# Patient Record
Sex: Male | Born: 1960 | Hispanic: No | Marital: Single | Smoking: Never smoker
Health system: Southern US, Community
[De-identification: ages and names within clinical notes are randomized; demographics above are authoritative.]

## PROBLEM LIST (undated history)

## (undated) DIAGNOSIS — I10 Essential (primary) hypertension: Secondary | ICD-10-CM

## (undated) DIAGNOSIS — N289 Disorder of kidney and ureter, unspecified: Secondary | ICD-10-CM

## (undated) HISTORY — PX: LITHOTRIPSY: SUR834

---

## 1998-08-11 ENCOUNTER — Ambulatory Visit (HOSPITAL_COMMUNITY): Admission: RE | Admit: 1998-08-11 | Discharge: 1998-08-11 | Payer: Self-pay | Admitting: Urology

## 1998-08-11 ENCOUNTER — Encounter: Payer: Self-pay | Admitting: Urology

## 1998-08-29 ENCOUNTER — Ambulatory Visit (HOSPITAL_COMMUNITY): Admission: RE | Admit: 1998-08-29 | Discharge: 1998-08-29 | Payer: Self-pay | Admitting: Urology

## 1998-08-29 ENCOUNTER — Encounter: Payer: Self-pay | Admitting: Urology

## 2013-08-18 ENCOUNTER — Encounter (HOSPITAL_COMMUNITY): Payer: Self-pay | Admitting: Emergency Medicine

## 2013-08-18 ENCOUNTER — Emergency Department (HOSPITAL_COMMUNITY)
Admission: EM | Admit: 2013-08-18 | Discharge: 2013-08-18 | Disposition: A | Discharge status: Home / Self Care | Payer: Worker's Compensation | Attending: Emergency Medicine | Admitting: Emergency Medicine

## 2013-08-18 DIAGNOSIS — W268XXA Contact with other sharp object(s), not elsewhere classified, initial encounter: Secondary | ICD-10-CM | POA: Insufficient documentation

## 2013-08-18 DIAGNOSIS — Z23 Encounter for immunization: Secondary | ICD-10-CM | POA: Insufficient documentation

## 2013-08-18 DIAGNOSIS — Y9389 Activity, other specified: Secondary | ICD-10-CM | POA: Insufficient documentation

## 2013-08-18 DIAGNOSIS — Y990 Civilian activity done for income or pay: Secondary | ICD-10-CM | POA: Diagnosis not present

## 2013-08-18 DIAGNOSIS — Y9289 Other specified places as the place of occurrence of the external cause: Secondary | ICD-10-CM | POA: Diagnosis not present

## 2013-08-18 DIAGNOSIS — S61209A Unspecified open wound of unspecified finger without damage to nail, initial encounter: Secondary | ICD-10-CM | POA: Diagnosis present

## 2013-08-18 DIAGNOSIS — S61011A Laceration without foreign body of right thumb without damage to nail, initial encounter: Secondary | ICD-10-CM

## 2013-08-18 MED ORDER — TETANUS-DIPHTH-ACELL PERTUSSIS 5-2.5-18.5 LF-MCG/0.5 IM SUSP
0.5000 mL | Freq: Once | INTRAMUSCULAR | Status: AC
  Administered 2013-08-18: 0.5 mL via INTRAMUSCULAR
  Filled 2013-08-18: qty 0.5

## 2013-08-18 NOTE — Discharge Instructions (Signed)
Take ibuprofen or tylenol as needed for pain.  Keep wound clean and dry.  Follow up with your primary care doctor or [facility] [phone]; [address]) in 7-10 days for wound recheck and suture removal.  You should be seen sooner if you develop fever, worsening pain or redness/drainage of pus at site of wound.    Laceration Care, Adult A laceration is a cut or lesion that goes through all layers of the skin and into the tissue just beneath the skin. TREATMENT  Some lacerations may not require closure. Some lacerations may not be able to be closed due to an increased risk of infection. It is important to see your caregiver as soon as possible after an injury to minimize the risk of infection and maximize the opportunity for successful closure. If closure is appropriate, pain medicines may be given, if needed. The wound will be cleaned to help prevent infection. Your caregiver will use stitches (sutures), staples, wound glue (adhesive), or skin adhesive strips to repair the laceration. These tools bring the skin edges together to allow for faster healing and a better cosmetic outcome. However, all wounds will heal with a scar. Once the wound has healed, scarring can be minimized by covering the wound with sunscreen during the day for 1 full year. HOME CARE INSTRUCTIONS  For sutures or staples:  Keep the wound clean and dry.  If you were given a bandage (dressing), you should change it at least once a day. Also, change the dressing if it becomes wet or dirty, or as directed by your caregiver.  Wash the wound with soap and water 2 times a day. Rinse the wound off with water to remove all soap. Pat the wound dry with a clean towel.  After cleaning, apply a thin layer of the antibiotic ointment as recommended by your caregiver. This will help prevent infection and keep the dressing from sticking.  You may shower as usual after the first 24 hours. Do not soak the wound in water until the  sutures are removed.  Only take over-the-counter or prescription medicines for pain, discomfort, or fever as directed by your caregiver.  Get your sutures or staples removed as directed by your caregiver. For skin adhesive strips:  Keep the wound clean and dry.  Do not get the skin adhesive strips wet. You may bathe carefully, using caution to keep the wound dry.  If the wound gets wet, pat it dry with a clean towel.  Skin adhesive strips will fall off on their own. You may trim the strips as the wound heals. Do not remove skin adhesive strips that are still stuck to the wound. They will fall off in time. For wound adhesive:  You may briefly wet your wound in the shower or bath. Do not soak or scrub the wound. Do not swim. Avoid periods of heavy perspiration until the skin adhesive has fallen off on its own. After showering or bathing, gently pat the wound dry with a clean towel.  Do not apply liquid medicine, cream medicine, or ointment medicine to your wound while the skin adhesive is in place. This may loosen the film before your wound is healed.  If a dressing is placed over the wound, be careful not to apply tape directly over the skin adhesive. This may cause the adhesive to be pulled off before the wound is healed.  Avoid prolonged exposure to sunlight or tanning lamps while the skin adhesive is in place.  Exposure to ultraviolet light in the first year will darken the scar.  The skin adhesive will usually remain in place for 5 to 10 days, then naturally fall off the skin. Do not pick at the adhesive film. You may need a tetanus shot if:  You cannot remember when you had your last tetanus shot.  You have never had a tetanus shot. If you get a tetanus shot, your arm may swell, get red, and feel warm to the touch. This is common and not a problem. If you need a tetanus shot and you choose not to have one, there is a rare chance of getting tetanus. Sickness from tetanus can be  serious. SEEK MEDICAL CARE IF:   You have redness, swelling, or increasing pain in the wound.  You see a red line that goes away from the wound.  You have yellowish-white fluid (pus) coming from the wound.  You have a fever.  You notice a bad smell coming from the wound or dressing.  Your wound breaks open before or after sutures have been removed.  You notice something coming out of the wound such as wood or glass.  Your wound is on your hand or foot and you cannot move a finger or toe. SEEK IMMEDIATE MEDICAL CARE IF:   Your pain is not controlled with prescribed medicine.  You have severe swelling around the wound causing pain and numbness or a change in color in your arm, hand, leg, or foot.  Your wound splits open and starts bleeding.  You have worsening numbness, weakness, or loss of function of any joint around or beyond the wound.  You develop painful lumps near the wound or on the skin anywhere on your body. MAKE SURE YOU:   Understand these instructions.  Will watch your condition.  Will get help right away if you are not doing well or get worse. Document Released: 05/21/2005 Document Revised: 08/13/2011 Document Reviewed: 11/14/2010 [facility] Patient Information 2014 [employer] [place].

## 2013-08-18 NOTE — ED Provider Notes (Signed)
Medical screening examination/treatment/procedure(s) were performed by non-physician practitioner and as supervising physician I was immediately available for consultation/collaboration.     Geoffery Lyonsouglas Donnisha Besecker, MD 08/18/13 [phone]

## 2013-08-18 NOTE — ED Notes (Signed)
Patent here with laceration to right thumb from circular blade.  Patient works at [employer].  Bleeding controlled at this time.

## 2013-08-18 NOTE — ED Notes (Signed)
DSD applied to right thumb.

## 2013-08-18 NOTE — ED Provider Notes (Signed)
CSN: [id number]     Arrival date & time 08/18/13  0053 History   First MD Initiated Contact with Patient 08/18/13 [phone]     Chief Complaint  Patient presents with  . Extremity Laceration     (Consider location/radiation/quality/duration/timing/severity/associated sxs/prior Treatment) HPI History provided by pt.   Pt cut his right thumb with razor blade at work this evening.  Pain minimal and bleeding controlled.  No associated paresthesias.  Most recent tetanus unknown.    History reviewed. No pertinent past medical history. History reviewed. No pertinent past surgical history. No family history on file. History  Substance Use Topics  . Smoking status: Never Smoker   . Smokeless tobacco: Not on file  . Alcohol Use: No    Review of Systems  All other systems reviewed and are negative.      Allergies  Review of patient's allergies indicates no known allergies.  Home Medications  No current outpatient prescriptions on file. BP 164/97  Pulse 90  Temp(Src) 98.6 F (37 C) (Oral)  Resp 17  SpO2 97% Physical Exam  Nursing note and vitals reviewed. Constitutional: He is oriented to person, place, and time. He appears well-developed and well-nourished. No distress.  HENT:  Head: Normocephalic and atraumatic.  Eyes:  Normal appearance  Neck: Normal range of motion.  Pulmonary/Chest: Effort normal.  Musculoskeletal: Normal range of motion.  2.5cm superficial, horizontal lac on lateral surface of distal phalanx of right thumb.  Oozing.  Clean.  Distal sensation intact.   Neurological: He is alert and oriented to person, place, and time.  Psychiatric: He has a normal mood and affect. His behavior is normal.    ED Course  Procedures (including critical care time) LACERATION REPAIR Performed by: Otilio MiuSCHINLEVER, Cecylia Brazill E Authorized by: Ruby ColaSCHINLEVER, Joshuwa Vecchio E Consent: Verbal consent obtained. Risks and benefits: risks, benefits and alternatives were discussed Consent given  by: patient Patient identity confirmed: provided demographic data Prepped and Draped in normal sterile fashion Wound explored  Laceration Location: right thumb  Laceration Length: 2.5cm  No Foreign Bodies seen or palpated  Anesthesia: local infiltration  Digital block: lidocaine 2% w/out epinephrine  Anesthetic total: 4 ml  Irrigation method: syringe Amount of cleaning: standard  Skin closure: prolene 4.0  Number of sutures: 6  Technique: simple interrupted  Patient tolerance: Patient tolerated the procedure well with no immediate complications.  Labs Review Labs Reviewed - No data to display Imaging Review No results found.   EKG Interpretation None      MDM   Final diagnoses:  Laceration of right thumb    52yo healthy M presents w/ lac to R thumb.  Wound cleaned and sutured and tetanus updated.  Return precautions discussed.     Otilio Miuatherine E Jujuan Dugo, PA-C 08/18/13 [phone]

## 2013-09-13 ENCOUNTER — Emergency Department (HOSPITAL_COMMUNITY)
Admission: EM | Admit: 2013-09-13 | Discharge: 2013-09-13 | Disposition: A | Discharge status: Home / Self Care | Payer: BC Managed Care – PPO | Attending: Emergency Medicine | Admitting: Emergency Medicine

## 2013-09-13 ENCOUNTER — Encounter (HOSPITAL_COMMUNITY): Payer: Self-pay | Admitting: Emergency Medicine

## 2013-09-13 DIAGNOSIS — I10 Essential (primary) hypertension: Secondary | ICD-10-CM | POA: Insufficient documentation

## 2013-09-13 DIAGNOSIS — Z87448 Personal history of other diseases of urinary system: Secondary | ICD-10-CM | POA: Insufficient documentation

## 2013-09-13 DIAGNOSIS — M436 Torticollis: Secondary | ICD-10-CM | POA: Insufficient documentation

## 2013-09-13 HISTORY — DX: Essential (primary) hypertension: I10

## 2013-09-13 HISTORY — DX: Disorder of kidney and ureter, unspecified: N28.9

## 2013-09-13 MED ORDER — NAPROXEN 500 MG PO TABS: 500.0000 mg | ORAL_TABLET | Freq: Two times a day (BID) | ORAL | Status: DC

## 2013-09-13 MED ORDER — OXYCODONE-ACETAMINOPHEN 5-325 MG PO TABS
1.0000 | ORAL_TABLET | Freq: Once | ORAL | Status: AC
Start: 2013-09-13 — End: 2013-09-13
  Administered 2013-09-13: 1 via ORAL
  Filled 2013-09-13: qty 1

## 2013-09-13 MED ORDER — KETOROLAC TROMETHAMINE 60 MG/2ML IM SOLN
60.0000 mg | Freq: Once | INTRAMUSCULAR | Status: AC
  Administered 2013-09-13: 60 mg via INTRAMUSCULAR
  Filled 2013-09-13: qty 2

## 2013-09-13 MED ORDER — CYCLOBENZAPRINE HCL 10 MG PO TABS
10.0000 mg | ORAL_TABLET | Freq: Once | ORAL | Status: AC
  Administered 2013-09-13: 10 mg via ORAL
  Filled 2013-09-13: qty 1

## 2013-09-13 MED ORDER — HYDROCODONE-ACETAMINOPHEN 5-325 MG PO TABS: 1.0000 | ORAL_TABLET | ORAL | Status: DC | PRN

## 2013-09-13 MED ORDER — CYCLOBENZAPRINE HCL 10 MG PO TABS: 10.0000 mg | ORAL_TABLET | Freq: Two times a day (BID) | ORAL | Status: DC | PRN

## 2013-09-13 NOTE — Discharge Instructions (Signed)
Apply warm wet compresses to the area. Take the medication as directed. Return as needed. °

## 2013-09-13 NOTE — ED Provider Notes (Signed)
CSN: [id number]     Arrival date & time 09/13/13  1335 History  This chart was scribed for non-physician practitioner, Kerrie BuffaloHope Jolan Upchurch, FNP,working with Benny LennertJoseph L Zammit, MD, by Karle PlumberJennifer Tensley, ED Scribe.  This patient was seen in room APFT24/APFT24 and the patient's care was started at 2:14 PM.  Chief Complaint  Patient presents with  . Torticollis   The history is provided by the patient. No language interpreter was used.   HPI Comments:  Benjamin Livingston is a 53 y.o. male with h/o HTN who presents to the Emergency Department complaining of severe right-sided neck tightness that started approximately six hours ago. Pt states that upon waking this morning he had severe pain with limited ROM secondary to the pain of his neck. Pt states he has not taken anything for pain. He states movement of his head makes the pain increase. He denies numbness or tingling in bilateral hands and fingers, cough, cold, congestion, fever, chills, or cervical spine tenderness.   Past Medical History  Diagnosis Date  . Hypertension   . Renal disorder    No past surgical history on file. No family history on file. History  Substance Use Topics  . Smoking status: Never Smoker   . Smokeless tobacco: Not on file  . Alcohol Use: No    Review of Systems  Constitutional: Negative for fever and chills.  HENT: Negative for congestion.   Respiratory: Negative for cough.   Musculoskeletal: Positive for neck pain. Negative for back pain.  Neurological: Negative for numbness.    Allergies  Review of patient's allergies indicates no known allergies.  Home Medications  No current outpatient prescriptions on file. Triage Vitals: BP 148/100  Pulse 85  Temp(Src) 98.4 F (36.9 C) (Oral)  Resp 20  Ht 6\' 1"  (1.854 m)  Wt 220 lb (99.791 kg)  BMI 29.03 kg/m2  SpO2 98% Physical Exam  Nursing note and vitals reviewed. Constitutional: He is oriented to person, place, and time. He appears well-developed and  well-nourished.  HENT:  Right Ear: Tympanic membrane, external ear and ear canal normal.  Left Ear: Tympanic membrane, external ear and ear canal normal.  Mouth/Throat: Uvula is midline, oropharynx is clear and moist and mucous membranes are normal.  No TMJ tenderness.  Eyes: Conjunctivae and EOM are normal. Pupils are equal, round, and reactive to light.  Neck: Neck supple.  Spasm noted to right trapezius. No cervical spine tenderness.  Cardiovascular: Normal rate, regular rhythm and normal heart sounds.  Exam reveals no gallop and no friction rub.   No murmur heard. Pulmonary/Chest: Effort normal and breath sounds normal. No respiratory distress. He has no wheezes. He has no rales.  Abdominal: Soft. There is no tenderness.  Musculoskeletal: Normal range of motion. He exhibits no edema and no tenderness.  Lymphadenopathy:    He has no cervical adenopathy.  Neurological: He is alert and oriented to person, place, and time. No cranial nerve deficit.  No neurological deficits. No evidence of nerve damage.  Skin: Skin is warm and dry.    ED Course  Procedures (including critical care time) DIAGNOSTIC STUDIES: Oxygen Saturation is 98% on RA, normal by my interpretation.   COORDINATION OF CARE: 2:18 PM- Will prescribe muscle relaxer, NSAID, and pain medication. Advised pt to apply warm, moist compresses to area three times daily. Pt verbalizes understanding and agrees to plan.  Medications  cyclobenzaprine (FLEXERIL) tablet 10 mg (10 mg Oral Given 09/13/13 1426)  oxyCODONE-acetaminophen (PERCOCET/ROXICET) 5-325 MG per tablet 1  tablet (1 tablet Oral Given 09/13/13 1426)  ketorolac (TORADOL) injection 60 mg (60 mg Intramuscular Given 09/13/13 1504)     MDM  53 y.o. male with sudden onset of right side neck pain upon awaking this am. Improved some with Toradol, Percocet and Flexeril here in the ED.  I have reviewed this patient's vital signs, nurses notes and discussed clinical findings  and plan of care. Patient voices understanding. Stable for discharge without neurological deficits and no cervical spin tenderness. Patient to apply warm wet compresses to the area and take the medications as directed. He will return for worsening symptoms, fever or other problems.    Medication List         cyclobenzaprine 10 MG tablet  Commonly known as:  FLEXERIL  Take 1 tablet (10 mg total) by mouth 2 (two) times daily as needed for muscle spasms.     HYDROcodone-acetaminophen 5-325 MG per tablet  Commonly known as:  NORCO/VICODIN  Take 1 tablet by mouth every 4 (four) hours as needed.     naproxen 500 MG tablet  Commonly known as:  NAPROSYN  Take 1 tablet (500 mg total) by mouth 2 (two) times daily.         I personally performed the services described in this documentation, which was scribed in my presence. The recorded information has been reviewed and is accurate.    [facility] Orlene Och, [place] 09/13/13 1622

## 2013-09-13 NOTE — ED Notes (Signed)
Pt states he woke up with stiffness in his neck

## 2013-09-16 NOTE — ED Provider Notes (Signed)
Medical screening examination/treatment/procedure(s) were performed by non-physician practitioner and as supervising physician I was immediately available for consultation/collaboration.   EKG Interpretation None        Emilia Kayes L Jenia Klepper, MD 09/16/13 0713 

## 2015-07-17 ENCOUNTER — Emergency Department (HOSPITAL_COMMUNITY)
Admission: EM | Admit: 2015-07-17 | Discharge: 2015-07-17 | Disposition: A | Discharge status: Home / Self Care | Payer: BLUE CROSS/BLUE SHIELD | Source: Home / Self Care | Attending: Family Medicine | Admitting: Family Medicine

## 2015-07-17 ENCOUNTER — Encounter (HOSPITAL_COMMUNITY): Payer: Self-pay | Admitting: Emergency Medicine

## 2015-07-17 DIAGNOSIS — H0489 Other disorders of lacrimal system: Secondary | ICD-10-CM | POA: Diagnosis not present

## 2015-07-17 DIAGNOSIS — Z91048 Other nonmedicinal substance allergy status: Secondary | ICD-10-CM | POA: Diagnosis not present

## 2015-07-17 DIAGNOSIS — Z9109 Other allergy status, other than to drugs and biological substances: Secondary | ICD-10-CM

## 2015-07-17 MED ORDER — AMOXICILLIN 500 MG PO CAPS: 500.0000 mg | ORAL_CAPSULE | Freq: Three times a day (TID) | ORAL | Status: DC

## 2015-07-17 MED ORDER — OLOPATADINE HCL 0.2 % OP SOLN: 1.0000 [drp] | OPHTHALMIC | Status: DC

## 2015-07-17 NOTE — ED Provider Notes (Signed)
CSN: [id number]     Arrival date & time 07/17/15  1301 History   First MD Initiated Contact with Patient 07/17/15 1407     Chief Complaint  Patient presents with  . Eye Drainage  . Stye   (Consider location/radiation/quality/duration/timing/severity/associated sxs/prior Treatment) HPI Comments: Patient presents with itching to the left eye x 4 days. He also reports intermittent swelling of the left inner eye and below the eye. No sinus congestion as he is aware and no fevers. Some left ear pain is noted. No history of allergies.  The history is provided by the patient.    Past Medical History  Diagnosis Date  . Hypertension   . Renal disorder    History reviewed. No pertinent past surgical history. No family history on file. Social History  Substance Use Topics  . Smoking status: Never Smoker   . Smokeless tobacco: None  . Alcohol Use: No    Review of Systems  Constitutional: Negative for fever, chills and fatigue.  HENT: Positive for ear pain. Negative for postnasal drip, rhinorrhea and sinus pressure.   Eyes: Positive for discharge, redness and itching. Negative for pain.  Respiratory: Negative.   Cardiovascular: Negative.   Musculoskeletal: Negative.   Skin: Negative.   Allergic/Immunologic: Negative.   Neurological: Negative.   Psychiatric/Behavioral: Negative.     Allergies  Review of patient's allergies indicates no known allergies.  Home Medications   Prior to Admission medications   Medication Sig Start Date End Date Taking? Authorizing Provider  amoxicillin (AMOXIL) 500 MG capsule Take 1 capsule (500 mg total) by mouth 3 (three) times daily. 07/17/15   Riki Sheer, PA-C  cyclobenzaprine (FLEXERIL) 10 MG tablet Take 1 tablet (10 mg total) by mouth 2 (two) times daily as needed for muscle spasms. 09/13/13   Hope Orlene Och, NP  HYDROcodone-acetaminophen (NORCO/VICODIN) 5-325 MG per tablet Take 1 tablet by mouth every 4 (four) hours as needed. 09/13/13   Hope Orlene Och, NP  naproxen (NAPROSYN) 500 MG tablet Take 1 tablet (500 mg total) by mouth 2 (two) times daily. 09/13/13   Hope Orlene Och, NP  Olopatadine HCl 0.2 % SOLN Apply 1 drop to eye 1 day or 1 dose. 07/17/15   Riki Sheer, PA-C   Meds Ordered and Administered this Visit  Medications - No data to display  BP 165/104 mmHg  Pulse 105  Temp(Src) 98.9 F (37.2 C) (Oral)  Resp 18  SpO2 99% No data found.   Physical Exam  Constitutional: He is oriented to person, place, and time. He appears well-developed and well-nourished. No distress.  HENT:  Head: Normocephalic and atraumatic.  Right Ear: External ear normal.  Left Ear: External ear normal.  Eyes: Pupils are equal, round, and reactive to light. Right eye exhibits no discharge. Left eye exhibits no discharge. No scleral icterus.  Mild swelling along left lacrimal gland, mild  Peri-orbital swelling. No sclera involvement and no drainage  Neurological: He is alert and oriented to person, place, and time.  Skin: Skin is warm and dry. He is not diaphoretic.  Psychiatric: His behavior is normal.  Nursing note and vitals reviewed.   ED Course  Procedures (including critical care time)  Labs Review Labs Reviewed - No data to display  Imaging Review No results found.   Visual Acuity Review  Right Eye Distance: 20/50 Left Eye Distance: 20/50 Bilateral Distance: 20/40  Right Eye Near:   Left Eye Near:    Bilateral Near:  MDM   1. Swelling of lacrimal duct with fluid   2. Environmental allergies    Probable viral and/or allergies. Given swelling cover empirically with antibiotics and allergen eye drops. F/U if worsens. Hot compresses.    Riki Sheer, PA-C 07/17/15 1430

## 2015-07-17 NOTE — Discharge Instructions (Signed)
Allergies An allergy is an abnormal reaction to a substance by the body's defense system (immune system). Allergies can develop at any age. WHAT CAUSES ALLERGIES? An allergic reaction happens when the immune system mistakenly reacts to a normally harmless substance, called an allergen, as if it were harmful. The immune system releases antibodies to fight the substance. Antibodies eventually release a chemical called histamine into the bloodstream. The release of histamine is meant to protect the body from infection, but it also causes discomfort. An allergic reaction can be triggered by:  Eating an allergen.  Inhaling an allergen.  Touching an allergen. WHAT TYPES OF ALLERGIES ARE THERE? There are many types of allergies. Common types include:  Seasonal allergies. People with this type of allergy are usually allergic to substances that are only present during certain seasons, such as molds and pollens.  Food allergies.  Drug allergies.  Insect allergies.  Animal dander allergies. WHAT ARE SYMPTOMS OF ALLERGIES? Possible allergy symptoms include:  Swelling of the lips, face, tongue, mouth, or throat.  Sneezing, coughing, or wheezing.  Nasal congestion.  Tingling in the mouth.  Rash.  Itching.  Itchy, red, swollen areas of skin (hives).  Watery eyes.  Vomiting.  Diarrhea.  Dizziness.  Lightheadedness.  Fainting.  Trouble breathing or swallowing.  Chest tightness.  Rapid heartbeat. HOW ARE ALLERGIES DIAGNOSED? Allergies are diagnosed with a medical and family history and one or more of the following:  Skin tests.  Blood tests.  A food diary. A food diary is a record of all the foods and drinks you have in a day and of all the symptoms you experience.  The results of an elimination diet. An elimination diet involves eliminating foods from your diet and then adding them back in one by one to find out if a certain food causes an allergic reaction. HOW ARE  ALLERGIES TREATED? There is no cure for allergies, but allergic reactions can be treated with medicine. Severe reactions usually need to be treated at a hospital. HOW CAN REACTIONS BE PREVENTED? The best way to prevent an allergic reaction is by avoiding the substance you are allergic to. Allergy shots and medicines can also help prevent reactions in some cases. People with severe allergic reactions may be able to prevent a life-threatening reaction called anaphylaxis with a medicine given right after exposure to the allergen.   This information is not intended to replace advice given to you by your health care provider. Make sure you discuss any questions you have with your health care provider.   Hopefully all of this is allergies and or viral. Covering you with an antibiotic for completeness. The drops are daily for itching/allergies. Warm compress to eye every few hours.  FU if worsens.   Document Released: 08/14/2002 Document Revised: 06/11/2014 Document Reviewed: 03/02/2014 Elsevier Interactive Patient Education [employer]

## 2015-07-17 NOTE — ED Notes (Signed)
Pt here with left itching, slight drainage and intermit swelling x 4 days Slight blurred vision without dizziness Tried eye drops, Benadryl with some relief

## 2018-12-12 ENCOUNTER — Encounter (HOSPITAL_COMMUNITY): Payer: Self-pay | Admitting: Emergency Medicine

## 2018-12-12 ENCOUNTER — Other Ambulatory Visit: Payer: Self-pay

## 2018-12-12 ENCOUNTER — Emergency Department (HOSPITAL_COMMUNITY)
Admission: EM | Admit: 2018-12-12 | Discharge: 2018-12-12 | Disposition: A | Discharge status: Home / Self Care | Payer: BC Managed Care – PPO | Attending: Emergency Medicine | Admitting: Emergency Medicine

## 2018-12-12 ENCOUNTER — Emergency Department (HOSPITAL_COMMUNITY): Payer: BC Managed Care – PPO

## 2018-12-12 DIAGNOSIS — S199XXA Unspecified injury of neck, initial encounter: Secondary | ICD-10-CM | POA: Diagnosis present

## 2018-12-12 DIAGNOSIS — S161XXA Strain of muscle, fascia and tendon at neck level, initial encounter: Secondary | ICD-10-CM | POA: Diagnosis not present

## 2018-12-12 DIAGNOSIS — Y999 Unspecified external cause status: Secondary | ICD-10-CM | POA: Diagnosis not present

## 2018-12-12 DIAGNOSIS — S46811A Strain of other muscles, fascia and tendons at shoulder and upper arm level, right arm, initial encounter: Secondary | ICD-10-CM | POA: Insufficient documentation

## 2018-12-12 DIAGNOSIS — I10 Essential (primary) hypertension: Secondary | ICD-10-CM | POA: Diagnosis not present

## 2018-12-12 DIAGNOSIS — S46812A Strain of other muscles, fascia and tendons at shoulder and upper arm level, left arm, initial encounter: Secondary | ICD-10-CM | POA: Diagnosis not present

## 2018-12-12 DIAGNOSIS — Y939 Activity, unspecified: Secondary | ICD-10-CM | POA: Insufficient documentation

## 2018-12-12 DIAGNOSIS — Y92410 Unspecified street and highway as the place of occurrence of the external cause: Secondary | ICD-10-CM | POA: Insufficient documentation

## 2018-12-12 DIAGNOSIS — T07XXXA Unspecified multiple injuries, initial encounter: Secondary | ICD-10-CM

## 2018-12-12 MED ORDER — TRAMADOL HCL 50 MG PO TABS: 50.0000 mg | ORAL_TABLET | Freq: Four times a day (QID) | ORAL | 0 refills | Status: AC | PRN

## 2018-12-12 MED ORDER — CYCLOBENZAPRINE HCL 10 MG PO TABS: 10.0000 mg | ORAL_TABLET | Freq: Three times a day (TID) | ORAL | 0 refills | Status: AC

## 2018-12-12 MED ORDER — ACETAMINOPHEN 500 MG PO TABS
1000.0000 mg | ORAL_TABLET | Freq: Once | ORAL | Status: AC
  Administered 2018-12-12: 1000 mg via ORAL
  Filled 2018-12-12: qty 2
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
## 2018-12-12 NOTE — Discharge Instructions (Addendum)
Your blood pressure is elevated at 153/111.  Please have this rechecked soon.  The CT scan of your cervical spine is negative for fracture or dislocation.  The examination shows some muscle strain and muscle spasm involving your upper trapezius muscles in the neck and shoulder area.  Heating pad to this area may be helpful.  You can expect to be sore over the next few days.  Please use Tylenol extra strength with breakfast, lunch, dinner, and at bedtime.  Use Flexeril 3 times daily for spasm area pain. Use Ultram for more severe pain. Ultram and Flexeril may cause drowsiness, and/or lightheadedness.  Please do not drive a vehicle, operate machinery, drink alcohol, or participate in activities requiring concentration when taking either these medications.  Please see your primary physician or return to the emergency department if not improving.
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
## 2020-11-06 ENCOUNTER — Encounter (HOSPITAL_COMMUNITY): Payer: Self-pay | Admitting: Emergency Medicine

## 2020-11-06 ENCOUNTER — Other Ambulatory Visit: Payer: Self-pay

## 2020-11-06 ENCOUNTER — Ambulatory Visit (HOSPITAL_COMMUNITY)
Admission: EM | Admit: 2020-11-06 | Discharge: 2020-11-06 | Disposition: A | Discharge status: Home / Self Care | Payer: BC Managed Care – PPO | Attending: Student | Admitting: Student

## 2020-11-06 DIAGNOSIS — W57XXXA Bitten or stung by nonvenomous insect and other nonvenomous arthropods, initial encounter: Secondary | ICD-10-CM | POA: Diagnosis not present

## 2020-11-06 DIAGNOSIS — Z0289 Encounter for other administrative examinations: Secondary | ICD-10-CM | POA: Diagnosis not present

## 2020-11-06 DIAGNOSIS — S30860A Insect bite (nonvenomous) of lower back and pelvis, initial encounter: Secondary | ICD-10-CM

## 2020-11-06 MED ORDER — DOXYCYCLINE HYCLATE 100 MG PO CAPS
100.0000 mg | ORAL_CAPSULE | Freq: Two times a day (BID) | ORAL | 0 refills | Status: AC
Start: 2020-11-06 — End: 2020-11-13

## 2020-11-06 NOTE — Discharge Instructions (Addendum)
-  Doxycycline twice daily for 7 days.  Make sure to wear sunscreen while spending time outside on this medication as it can increase your chance of sunburn. -You can try warm compresses to help loosen up the remaining part of the tick. -Symptoms of Lyme disease are a bull's-eye rash, fever/chills, body aches.  If you develop the symptoms despite antibiotic treatment, seek additional medical attention.

## 2020-11-06 NOTE — ED Provider Notes (Signed)
[facility]    CSN: [id number] Arrival date & time: 11/06/20  1123      History   Chief Complaint Chief Complaint  Patient presents with  . Insect Bite    HPI Benjamin Livingston is a 60 y.o. male presenting with a tick bite on his testicles.  Medical history hypertension and kidney stones.  States he spent time outside wearing shorts.  This morning he noticed a tick on his right testicle.  States he removed this with tweezers, but he is concerned that some of the head is left.  States he sees a black dot on the right testicle.  He is feeling well otherwise, denies rashes, fever/chills, body aches.  Denies recent URI.  He is not immunocompromise. Tdap UTD 2015.  HPI  Past Medical History:  Diagnosis Date  . Hypertension   . Renal disorder    kidney stones    There are no problems to display for this patient.   Past Surgical History:  Procedure Laterality Date  . LITHOTRIPSY         Home Medications    Prior to Admission medications   Medication Sig Start Date End Date Taking? Authorizing Provider  doxycycline (VIBRAMYCIN) 100 MG capsule Take 1 capsule (100 mg total) by mouth 2 (two) times daily for 7 days. 11/06/20 11/13/20 Yes Rhys Martini, PA-C  amLODipine-benazepril (LOTREL) 5-20 MG capsule Take 1 capsule by mouth daily. 11/20/18   [provider]  chlorthalidone (HYGROTON) 25 MG tablet Take 25 mg by mouth daily. 11/20/18   [provider]  cyclobenzaprine (FLEXERIL) 10 MG tablet Take 1 tablet (10 mg total) by mouth 3 (three) times daily. 12/12/18   Ivery Quale, PA-C  traMADol (ULTRAM) 50 MG tablet Take 1 tablet (50 mg total) by mouth every 6 (six) hours as needed. 12/12/18   Ivery Quale, PA-C  VASCEPA 1 g CAPS Take 2 capsules by mouth 2 (two) times a day. 11/20/18   [provider]    Family History History reviewed. No pertinent family history.  Social History Social History   Tobacco Use  . Smoking status: Never Smoker   . Smokeless tobacco: Never Used  Vaping Use  . Vaping Use: Never used  Substance Use Topics  . Alcohol use: Yes    Comment: occ  . Drug use: No     Allergies   Patient has no known allergies.   Review of Systems Review of Systems  Skin:       Tick   All other systems reviewed and are negative.    Physical Exam Triage Vital Signs ED Triage Vitals  Enc Vitals Group     BP 11/06/20 1217 (!) 145/91     Pulse Rate 11/06/20 1217 (!) 113     Resp 11/06/20 1217 16     Temp 11/06/20 1217 99 F (37.2 C)     Temp Source 11/06/20 1217 Oral     SpO2 11/06/20 1217 100 %     Weight --      Height --      Head Circumference --      Peak Flow --      Pain Score 11/06/20 1215 0     Pain Loc --      Pain Edu? --      Excl. in GC? --    No data found.  Updated Vital Signs BP (!) 145/91 (BP Location: Right Arm)   Pulse (!) 113   Temp  99 F (37.2 C) (Oral)   Resp 16   SpO2 100%   Visual Acuity Right Eye Distance:   Left Eye Distance:   Bilateral Distance:    Right Eye Near:   Left Eye Near:    Bilateral Near:     Physical Exam Vitals reviewed. Exam conducted with a chaperone present.  Constitutional:      Appearance: Normal appearance.  HENT:     Head: Normocephalic and atraumatic.  Cardiovascular:     Rate and Rhythm: Normal rate.  Genitourinary:    Comments: Chaperone Charna Elizabeth R testicle with tiny .70mm black dot. Possibly part of tick head. No tick body present. No erythema, swelling, tenderness, fevers/chills.  Neurological:     General: No focal deficit present.     Mental Status: He is alert and oriented to person, place, and time.  Psychiatric:        Mood and Affect: Mood normal.        Behavior: Behavior normal.        Thought Content: Thought content normal.        Judgment: Judgment normal.      UC Treatments / Results  Labs (all labs ordered are listed, but only abnormal results are displayed) Labs Reviewed - No data to  display  EKG   Radiology No results found.  Procedures Procedures (including critical care time)  Medications Ordered in UC Medications - No data to display  Initial Impression / Assessment and Plan / UC Course  I have reviewed the triage vital signs and the nursing notes.  Pertinent labs & imaging results that were available during my care of the patient were reviewed by me and considered in my medical decision making (see chart for details).     This patient is a 60 year old male presenting following tick bite R testicle. I believe the tick has been removed, but there is a minute black dot on right testicle that could possible be part of tick head. Will treat with doxycycline as below. Tdap UTD. ED return precautions discussed.   Final Clinical Impressions(s) / UC Diagnoses   Final diagnoses:  Tick bite of pelvic region, initial encounter  Encounter to obtain excuse from work     Discharge Instructions     -Doxycycline twice daily for 7 days.  Make sure to wear sunscreen while spending time outside on this medication as it can increase your chance of sunburn. -You can try warm compresses to help loosen up the remaining part of the tick. -Symptoms of Lyme disease are a bull's-eye rash, fever/chills, body aches.  If you develop the symptoms despite antibiotic treatment, seek additional medical attention.    ED Prescriptions    Medication Sig Dispense Auth. Provider   doxycycline (VIBRAMYCIN) 100 MG capsule Take 1 capsule (100 mg total) by mouth 2 (two) times daily for 7 days. 14 capsule Rhys Martini, PA-C     PDMP not reviewed this encounter.   Rhys Martini, PA-C 11/06/20 1327

## 2020-11-06 NOTE — ED Triage Notes (Signed)
Pt presents with tick bite on testicles. States pulled tick off this am but does not feel that all of the tick was removed.

## 2023-03-16 DIAGNOSIS — Z23 Encounter for immunization: Secondary | ICD-10-CM | POA: Diagnosis not present
